# Patient Record
Sex: Female | Born: 1980 | Race: Black or African American | Hispanic: No | Marital: Married | Smoking: Never smoker
Health system: Southern US, Community
[De-identification: ages and names within clinical notes are randomized; demographics above are authoritative.]

## PROBLEM LIST (undated history)

## (undated) HISTORY — PX: TUBAL LIGATION: SHX77

## (undated) HISTORY — PX: ANKLE SURGERY: SHX546

---

## 2022-02-26 ENCOUNTER — Emergency Department (HOSPITAL_BASED_OUTPATIENT_CLINIC_OR_DEPARTMENT_OTHER): Payer: BC Managed Care – PPO

## 2022-02-26 ENCOUNTER — Emergency Department (HOSPITAL_BASED_OUTPATIENT_CLINIC_OR_DEPARTMENT_OTHER)
Admission: EM | Admit: 2022-02-26 | Discharge: 2022-02-26 | Disposition: A | Discharge status: Home / Self Care | Payer: BC Managed Care – PPO | Attending: Emergency Medicine | Admitting: Emergency Medicine

## 2022-02-26 ENCOUNTER — Encounter (HOSPITAL_BASED_OUTPATIENT_CLINIC_OR_DEPARTMENT_OTHER): Payer: Self-pay | Admitting: *Deleted

## 2022-02-26 DIAGNOSIS — S6991XA Unspecified injury of right wrist, hand and finger(s), initial encounter: Secondary | ICD-10-CM | POA: Diagnosis present

## 2022-02-26 DIAGNOSIS — S6391XA Sprain of unspecified part of right wrist and hand, initial encounter: Secondary | ICD-10-CM | POA: Diagnosis not present

## 2022-02-26 DIAGNOSIS — M79641 Pain in right hand: Secondary | ICD-10-CM

## 2022-02-26 NOTE — ED Notes (Signed)
Ice pack provided, encouraged elevation ?
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
## 2022-02-26 NOTE — ED Notes (Signed)
Splint to rt hand applied by NT per ED MD orders, CMS remained WNL. Pt states hand feels much better post splint application. AVS reviewed with client, discussed pain control with elevation, ice and ibuprofen all per ED PA recommendations. Also provided name of MD to follow up with. Copy of AVS given to pt. Opportunity for questions provided prior to DC to home with significant other.  ?

## 2022-02-26 NOTE — Discharge Instructions (Signed)
Motrin and Tylenol as needed as directed for pain.  Apply ice and elevate for 20 minutes at a time.  Follow-up with orthopedics if pain persists, referral given, call to schedule appointment. ?

## 2022-04-26 ENCOUNTER — Emergency Department (HOSPITAL_BASED_OUTPATIENT_CLINIC_OR_DEPARTMENT_OTHER): Payer: BC Managed Care – PPO

## 2022-04-26 ENCOUNTER — Emergency Department (HOSPITAL_BASED_OUTPATIENT_CLINIC_OR_DEPARTMENT_OTHER)
Admission: EM | Admit: 2022-04-26 | Discharge: 2022-04-26 | Disposition: A | Discharge status: Home / Self Care | Payer: BC Managed Care – PPO | Attending: Emergency Medicine | Admitting: Emergency Medicine

## 2022-04-26 ENCOUNTER — Encounter (HOSPITAL_BASED_OUTPATIENT_CLINIC_OR_DEPARTMENT_OTHER): Payer: Self-pay | Admitting: Emergency Medicine

## 2022-04-26 ENCOUNTER — Other Ambulatory Visit: Payer: Self-pay

## 2022-04-26 DIAGNOSIS — X58XXXA Exposure to other specified factors, initial encounter: Secondary | ICD-10-CM | POA: Diagnosis not present

## 2022-04-26 DIAGNOSIS — S39011A Strain of muscle, fascia and tendon of abdomen, initial encounter: Secondary | ICD-10-CM | POA: Diagnosis not present

## 2022-04-26 DIAGNOSIS — Y9389 Activity, other specified: Secondary | ICD-10-CM | POA: Insufficient documentation

## 2022-04-26 DIAGNOSIS — R109 Unspecified abdominal pain: Secondary | ICD-10-CM

## 2022-04-26 DIAGNOSIS — S3991XA Unspecified injury of abdomen, initial encounter: Secondary | ICD-10-CM | POA: Diagnosis present

## 2022-04-26 DIAGNOSIS — T148XXA Other injury of unspecified body region, initial encounter: Secondary | ICD-10-CM

## 2022-04-26 LAB — CBC WITH DIFFERENTIAL/PLATELET
Abs Immature Granulocytes: 0.01 10*3/uL (ref 0.00–0.07)
Basophils Absolute: 0 10*3/uL (ref 0.0–0.1)
Basophils Relative: 1 %
Eosinophils Absolute: 0.1 10*3/uL (ref 0.0–0.5)
Eosinophils Relative: 1 %
HCT: 32.6 % — ABNORMAL LOW (ref 36.0–46.0)
Hemoglobin: 10.4 g/dL — ABNORMAL LOW (ref 12.0–15.0)
Immature Granulocytes: 0 %
Lymphocytes Relative: 34 %
Lymphs Abs: 1.4 10*3/uL (ref 0.7–4.0)
MCH: 24.9 pg — ABNORMAL LOW (ref 26.0–34.0)
MCHC: 31.9 g/dL (ref 30.0–36.0)
MCV: 78 fL — ABNORMAL LOW (ref 80.0–100.0)
Monocytes Absolute: 0.5 10*3/uL (ref 0.1–1.0)
Monocytes Relative: 12 %
Neutro Abs: 2.2 10*3/uL (ref 1.7–7.7)
Neutrophils Relative %: 52 %
Platelets: 309 10*3/uL (ref 150–400)
RBC: 4.18 MIL/uL (ref 3.87–5.11)
RDW: 15.2 % (ref 11.5–15.5)
WBC: 4.2 10*3/uL (ref 4.0–10.5)
nRBC: 0 % (ref 0.0–0.2)

## 2022-04-26 LAB — URINALYSIS, ROUTINE W REFLEX MICROSCOPIC
Bilirubin Urine: NEGATIVE
Glucose, UA: NEGATIVE mg/dL
Ketones, ur: NEGATIVE mg/dL
Leukocytes,Ua: NEGATIVE
Nitrite: NEGATIVE
Protein, ur: NEGATIVE mg/dL
Specific Gravity, Urine: 1.025 (ref 1.005–1.030)
pH: 7 (ref 5.0–8.0)

## 2022-04-26 LAB — COMPREHENSIVE METABOLIC PANEL
ALT: 35 U/L (ref 0–44)
AST: 33 U/L (ref 15–41)
Albumin: 3.5 g/dL (ref 3.5–5.0)
Alkaline Phosphatase: 42 U/L (ref 38–126)
Anion gap: 7 (ref 5–15)
BUN: 12 mg/dL (ref 6–20)
CO2: 24 mmol/L (ref 22–32)
Calcium: 8.6 mg/dL — ABNORMAL LOW (ref 8.9–10.3)
Chloride: 106 mmol/L (ref 98–111)
Creatinine, Ser: 0.74 mg/dL (ref 0.44–1.00)
GFR, Estimated: >60 mL/min (ref >60)
Glucose, Bld: 95 mg/dL (ref 70–99)
Potassium: 3.6 mmol/L (ref 3.5–5.1)
Sodium: 137 mmol/L (ref 135–145)
Total Bilirubin: 0.3 mg/dL (ref 0.3–1.2)
Total Protein: 8.1 g/dL (ref 6.5–8.1)

## 2022-04-26 LAB — LIPASE, BLOOD: Lipase: 35 U/L (ref 11–51)

## 2022-04-26 LAB — URINALYSIS, MICROSCOPIC (REFLEX)

## 2022-04-26 LAB — PREGNANCY, URINE: Preg Test, Ur: NEGATIVE

## 2022-04-26 MED ORDER — METHOCARBAMOL 500 MG PO TABS: 500.0000 mg | ORAL_TABLET | Freq: Two times a day (BID) | ORAL | 0 refills | Status: DC

## 2022-04-26 MED ORDER — IBUPROFEN 800 MG PO TABS
800.0000 mg | ORAL_TABLET | Freq: Once | ORAL | Status: AC
  Administered 2022-04-26: 800 mg via ORAL
  Filled 2022-04-26: qty 1

## 2022-04-26 NOTE — ED Notes (Signed)
Patient to CT via wheelchair.

## 2022-04-26 NOTE — ED Triage Notes (Signed)
Pt c/o LUQ abdominal pain x 1 week. Last BM today. Describes pain as intermittent stabbing feeling. Denies urinary symptoms, n/v/d, fevers, vaginal discharge.

## 2022-04-26 NOTE — Discharge Instructions (Signed)
Please use Tylenol or ibuprofen for pain.  You may use 600 mg ibuprofen every 6 hours or 1000 mg of Tylenol every 6 hours.  You may choose to alternate between the 2.  This would be most effective.  Not to exceed 4 g of Tylenol within 24 hours.  Not to exceed 3200 mg ibuprofen 24 hours.  You can use the muscle relaxant I am prescribing up to twice daily in addition to the above.  So that may make you slightly drowsy, I would wait an hour after taking it for the first time before piloting a motor vehicle or operating heavy machinery to make sure that you do not feel too drowsy.  It is a nonnarcotic medications as long as you do not feel too drowsy or able to perform these activities.  If your symptoms persist despite treatment as above I recommend following up with your PCP and/or orthopedic doctor.

## 2022-04-26 NOTE — ED Notes (Signed)
Rx x 1 given  Written and verbal inst to pt  Verbalized an understanding  To home with son

## 2022-10-14 ENCOUNTER — Other Ambulatory Visit: Payer: Self-pay

## 2022-10-14 ENCOUNTER — Encounter (HOSPITAL_BASED_OUTPATIENT_CLINIC_OR_DEPARTMENT_OTHER): Payer: Self-pay | Admitting: Emergency Medicine

## 2022-10-14 ENCOUNTER — Emergency Department (HOSPITAL_BASED_OUTPATIENT_CLINIC_OR_DEPARTMENT_OTHER)
Admission: EM | Admit: 2022-10-14 | Discharge: 2022-10-15 | Disposition: A | Discharge status: Home / Self Care | Payer: BC Managed Care – PPO | Attending: Emergency Medicine | Admitting: Emergency Medicine

## 2022-10-14 DIAGNOSIS — R1012 Left upper quadrant pain: Secondary | ICD-10-CM | POA: Diagnosis present

## 2022-10-14 DIAGNOSIS — K5901 Slow transit constipation: Secondary | ICD-10-CM | POA: Insufficient documentation

## 2022-10-14 LAB — CBC WITH DIFFERENTIAL/PLATELET
Abs Immature Granulocytes: 0.01 10*3/uL (ref 0.00–0.07)
Basophils Absolute: 0 10*3/uL (ref 0.0–0.1)
Basophils Relative: 1 %
Eosinophils Absolute: 0.1 10*3/uL (ref 0.0–0.5)
Eosinophils Relative: 3 %
HCT: 33.9 % — ABNORMAL LOW (ref 36.0–46.0)
Hemoglobin: 10.8 g/dL — ABNORMAL LOW (ref 12.0–15.0)
Immature Granulocytes: 0 %
Lymphocytes Relative: 50 %
Lymphs Abs: 2.1 10*3/uL (ref 0.7–4.0)
MCH: 24.3 pg — ABNORMAL LOW (ref 26.0–34.0)
MCHC: 31.9 g/dL (ref 30.0–36.0)
MCV: 76.2 fL — ABNORMAL LOW (ref 80.0–100.0)
Monocytes Absolute: 0.5 10*3/uL (ref 0.1–1.0)
Monocytes Relative: 11 %
Neutro Abs: 1.5 10*3/uL — ABNORMAL LOW (ref 1.7–7.7)
Neutrophils Relative %: 35 %
Platelets: 401 10*3/uL — ABNORMAL HIGH (ref 150–400)
RBC: 4.45 MIL/uL (ref 3.87–5.11)
RDW: 14.6 % (ref 11.5–15.5)
WBC: 4.2 10*3/uL (ref 4.0–10.5)
nRBC: 0 % (ref 0.0–0.2)

## 2022-10-14 LAB — URINALYSIS, ROUTINE W REFLEX MICROSCOPIC
Bilirubin Urine: NEGATIVE
Glucose, UA: NEGATIVE mg/dL
Ketones, ur: NEGATIVE mg/dL
Leukocytes,Ua: NEGATIVE
Nitrite: NEGATIVE
Protein, ur: NEGATIVE mg/dL
Specific Gravity, Urine: 1.015 (ref 1.005–1.030)
pH: 7.5 (ref 5.0–8.0)

## 2022-10-14 LAB — URINALYSIS, MICROSCOPIC (REFLEX)

## 2022-10-14 LAB — PREGNANCY, URINE: Preg Test, Ur: NEGATIVE

## 2022-10-14 MED ORDER — FENTANYL CITRATE PF 50 MCG/ML IJ SOSY
50.0000 ug | PREFILLED_SYRINGE | Freq: Once | INTRAMUSCULAR | Status: AC
  Administered 2022-10-15: 50 ug via INTRAVENOUS
  Filled 2022-10-14: qty 1

## 2022-10-14 MED ORDER — ONDANSETRON HCL 4 MG/2ML IJ SOLN
4.0000 mg | Freq: Once | INTRAMUSCULAR | Status: AC
  Administered 2022-10-15: 4 mg via INTRAVENOUS
  Filled 2022-10-14: qty 2

## 2022-10-14 NOTE — ED Provider Notes (Signed)
   [facility] Provider Note: Lowella Dell, MD, FACEP  CSN: [id number] MRN: [id number] ARRIVAL: 10/14/22 at 2308 ROOM: [facility]10/[facility]10   CHIEF COMPLAINT  Abdominal Pain   HISTORY OF PRESENT ILLNESS  10/14/22 11:27 PM Brenda Preston is a 41 y.o. female who was treated for pyelonephritis with 10 days of Macrobid.  Her symptoms were left upper quadrant pain, urinary urgency, urinary frequency and burning with urination.  Despite taking the entire course of Macrobid her symptoms have persisted and in fact have worsened somewhat, particularly of the left upper quadrant pain.  She describes the pain as aching and sharp.  It is worse with movement or palpation.  She is having some low back pain with this as well as nausea but no vomiting.  She has had chills but is not aware of having fever.   No past medical history on file.  Past Surgical History:  Procedure Laterality Date   ANKLE SURGERY Right    TUBAL LIGATION Bilateral     No family history on file.  Social History   Tobacco Use   Smoking status: Never   Smokeless tobacco: Never  Substance Use Topics   Alcohol use: Yes    Comment: 3+ liquor drinks per day   Drug use: Never    Prior to Admission medications   Medication Sig Start Date End Date Taking? Authorizing Provider  methocarbamol (ROBAXIN) 500 MG tablet Take 1 tablet (500 mg total) by mouth 2 (two) times daily. 04/26/22   Prosperi, Christian H, PA-C    Allergies Hydrocodone   REVIEW OF SYSTEMS  Negative except as noted here or in the History of Present Illness.   PHYSICAL EXAMINATION  Initial Vital Signs Blood pressure (!) 162/102, pulse 76, temperature 98.2 F (36.8 C), temperature source Oral, resp. rate 20, height 5\' 8"  (1.727 m), weight 95.3 kg, SpO2 99 %.  Examination General: Well-developed, well-nourished female in no acute distress; appearance consistent with age of record HENT: normocephalic; atraumatic Eyes: Normal appearance Neck:  supple Heart: regular rate and rhythm Lungs: clear to auscultation bilaterally Abdomen: soft; nondistended; left upper quadrant tenderness; suprapubic tenderness; bowel sounds present GU: No CVA tenderness Extremities: No deformity; full range of motion Neurologic: Awake, alert and oriented; motor function intact in all extremities and symmetric; no facial droop Skin: Warm and dry Psychiatric: Normal mood and affect   RESULTS  Summary of this visit's results, reviewed and interpreted by myself:   EKG Interpretation  Date/Time:    Ventricular Rate:    PR Interval:    QRS Duration:   QT Interval:    QTC Calculation:   R Axis:     Text Interpretation:         Laboratory Studies: No results found for this or any previous visit (from the past 24 hour(s)). Imaging Studies: No results found.  ED COURSE and MDM  Nursing notes, initial and subsequent vitals signs, including pulse oximetry, reviewed and interpreted by myself.  Vitals:   10/14/22 2325 10/14/22 2326  BP: (!) 162/102   Pulse: 76   Resp: 20   Temp: 98.2 F (36.8 C)   TempSrc: Oral   SpO2: 99%   Weight:  95.3 kg  Height:  5\' 8"  (1.727 m)   Medications - No data to display    PROCEDURES  Procedures   ED DIAGNOSES  No diagnosis found.

## 2022-10-14 NOTE — ED Triage Notes (Signed)
Pt states she was seen and tx for pyelonephritis ~ 10 days ago. She finished the abx (Macrobid) and continues having L flank and LLQ pain. She states "there is something in my pee", and c/o feeling generally unwell.

## 2022-10-15 ENCOUNTER — Emergency Department (HOSPITAL_BASED_OUTPATIENT_CLINIC_OR_DEPARTMENT_OTHER): Payer: BC Managed Care – PPO

## 2022-10-15 ENCOUNTER — Encounter (HOSPITAL_BASED_OUTPATIENT_CLINIC_OR_DEPARTMENT_OTHER): Payer: Self-pay

## 2022-10-15 LAB — COMPREHENSIVE METABOLIC PANEL
ALT: 42 U/L (ref 0–44)
AST: 34 U/L (ref 15–41)
Albumin: 4.2 g/dL (ref 3.5–5.0)
Alkaline Phosphatase: 50 U/L (ref 38–126)
Anion gap: 9 (ref 5–15)
BUN: 15 mg/dL (ref 6–20)
CO2: 25 mmol/L (ref 22–32)
Calcium: 9 mg/dL (ref 8.9–10.3)
Chloride: 104 mmol/L (ref 98–111)
Creatinine, Ser: 1.08 mg/dL — ABNORMAL HIGH (ref 0.44–1.00)
GFR, Estimated: >60 mL/min (ref >60)
Glucose, Bld: 94 mg/dL (ref 70–99)
Potassium: 3.6 mmol/L (ref 3.5–5.1)
Sodium: 138 mmol/L (ref 135–145)
Total Bilirubin: 0.3 mg/dL (ref 0.3–1.2)
Total Protein: 9.2 g/dL — ABNORMAL HIGH (ref 6.5–8.1)

## 2022-10-15 LAB — LIPASE, BLOOD: Lipase: 53 U/L — ABNORMAL HIGH (ref 11–51)

## 2022-10-15 MED ORDER — IOHEXOL 300 MG/ML  SOLN
100.0000 mL | Freq: Once | INTRAMUSCULAR | Status: AC | PRN
  Administered 2022-10-15: 100 mL via INTRAVENOUS

## 2022-10-15 MED ORDER — MAGNESIUM CITRATE PO SOLN: 1.0000 | Freq: Once | ORAL | Status: DC

## 2022-10-15 NOTE — ED Notes (Signed)
Patient will buy laxative over counter.

## 2022-10-16 LAB — URINE CULTURE: Culture: <10000 — AB

## 2022-11-29 ENCOUNTER — Emergency Department (HOSPITAL_BASED_OUTPATIENT_CLINIC_OR_DEPARTMENT_OTHER)
Admission: EM | Admit: 2022-11-29 | Discharge: 2022-11-29 | Disposition: A | Discharge status: Home / Self Care | Payer: 59 | Attending: Emergency Medicine | Admitting: Emergency Medicine

## 2022-11-29 ENCOUNTER — Other Ambulatory Visit: Payer: Self-pay

## 2022-11-29 ENCOUNTER — Encounter (HOSPITAL_BASED_OUTPATIENT_CLINIC_OR_DEPARTMENT_OTHER): Payer: Self-pay | Admitting: Emergency Medicine

## 2022-11-29 ENCOUNTER — Emergency Department (HOSPITAL_BASED_OUTPATIENT_CLINIC_OR_DEPARTMENT_OTHER): Payer: 59

## 2022-11-29 DIAGNOSIS — M5416 Radiculopathy, lumbar region: Secondary | ICD-10-CM | POA: Insufficient documentation

## 2022-11-29 DIAGNOSIS — M549 Dorsalgia, unspecified: Secondary | ICD-10-CM | POA: Diagnosis present

## 2022-11-29 MED ORDER — DICLOFENAC SODIUM 50 MG PO TBEC: 50.0000 mg | DELAYED_RELEASE_TABLET | Freq: Two times a day (BID) | ORAL | 0 refills | Status: AC

## 2022-11-29 MED ORDER — METHOCARBAMOL 500 MG PO TABS: 1000.0000 mg | ORAL_TABLET | Freq: Three times a day (TID) | ORAL | 0 refills | Status: DC | PRN

## 2022-11-29 MED ORDER — FAMOTIDINE 20 MG PO TABS: 20.0000 mg | ORAL_TABLET | Freq: Two times a day (BID) | ORAL | 0 refills | Status: AC

## 2022-11-29 MED ORDER — OXYCODONE-ACETAMINOPHEN 5-325 MG PO TABS
1.0000 | ORAL_TABLET | Freq: Once | ORAL | Status: AC
  Administered 2022-11-29: 1 via ORAL
  Filled 2022-11-29: qty 1

## 2022-11-29 MED ORDER — METHYLPREDNISOLONE 4 MG PO TBPK: ORAL_TABLET | ORAL | 0 refills | Status: AC

## 2022-11-29 NOTE — ED Triage Notes (Signed)
Left side back pain X 2 days states runs down leg and "feels  funny" denies injury.

## 2022-11-29 NOTE — ED Provider Notes (Signed)
[place] [facility] Provider Note   CSN: [id number] Arrival date & time: 11/29/22  2751     History  Chief Complaint  Patient presents with   Back Pain    Brenda Preston is a 42 y.o. female.  The history is provided by the patient.  Back Pain Brenda Preston is a 42 y.o. female who presents to the Emergency Department complaining of leg pain.  She presents to the emergency department complaining of pain to her left buttocks, hip and leg.  Pain started 2 days ago.  It is described as a shooting constant pain.  It does not change with movement.  She did take Motrin and tizanidine prior to ED arrival with very mild improvement in her symptoms.  She states it is difficult to find a comfortable position.  No reported injuries but she did recently start a job at [employer] 2 weeks ago.  No back pain, abdominal pain, fevers, nausea, vomiting, dysuria, incontinence.  No known medical problems.  No routine medications.  She does have a remote history of sciatica years ago but has not had an issue recently.  She does not have numbness or weakness in that leg but she feels like it is hard to move at times and is not sure if this is secondary to pain or strength issues.   No tobacco, occasional alcohol, no street drugs.   No hx/o blood stream infection.  No recent surgery.      Home Medications Prior to Admission medications   Medication Sig Start Date End Date Taking? Authorizing Provider  diclofenac (VOLTAREN) 50 MG EC tablet Take 1 tablet (50 mg total) by mouth 2 (two) times daily. 11/29/22  Yes Quintella Reichert, MD  famotidine (PEPCID) 20 MG tablet Take 1 tablet (20 mg total) by mouth 2 (two) times daily. 11/29/22  Yes Quintella Reichert, MD  methocarbamol (ROBAXIN) 500 MG tablet Take 2 tablets (1,000 mg total) by mouth every 8 (eight) hours as needed for muscle spasms. 11/29/22  Yes Quintella Reichert, MD  methylPREDNISolone (MEDROL DOSEPAK) 4 MG TBPK tablet Take  according to label instructions 11/29/22  Yes Quintella Reichert, MD      Allergies    Hydrocodone    Review of Systems   Review of Systems  Musculoskeletal:  Positive for back pain.  All other systems reviewed and are negative.   Physical Exam Updated Vital Signs BP 118/81 (BP Location: Right Arm)   Pulse 93   Temp 99.2 F (37.3 C) (Oral)   Resp 16   Ht 5\' 7"  (1.702 m)   Wt 95.3 kg   LMP 11/05/2022 (Approximate)   SpO2 99%   BMI 32.89 kg/m  Physical Exam Vitals and nursing note reviewed.  Constitutional:      Appearance: She is well-developed.  HENT:     Head: Normocephalic and atraumatic.  Cardiovascular:     Rate and Rhythm: Normal rate and regular rhythm.  Pulmonary:     Effort: Pulmonary effort is normal. No respiratory distress.  Abdominal:     Palpations: Abdomen is soft.     Tenderness: There is no abdominal tenderness. There is no guarding or rebound.  Musculoskeletal:     Comments: 2+ DP pulses bilaterally.  There is no midline thoracic or lumbar tenderness to palpation.  There is mild tenderness palpation over the left lateral hip.  She is able to fully range the hip and knee.  Skin:    General: Skin is warm  and dry.  Neurological:     Mental Status: She is alert and oriented to person, place, and time.     Comments: 5 out of 5 strength in bilateral lower extremities with sensation to light touch intact in bilateral lower extremities.  2+ patellar reflexes bilaterally.  No ankle clonus.  Antalgic gait.  Psychiatric:        Behavior: Behavior normal.     ED Results / Procedures / Treatments   Labs (all labs ordered are listed, but only abnormal results are displayed) Labs Reviewed - No data to display  EKG None  Radiology DG Hip Unilat W or Wo Pelvis 2-3 Views Left  Result Date: 11/29/2022 CLINICAL DATA:  42 year old female with left side back pain radiating to the left hip and leg for 2 days with no known injury. EXAM: DG HIP (WITH OR WITHOUT  PELVIS) 2-3V LEFT COMPARISON:  CT Abdomen and Pelvis 10/15/2022. FINDINGS: Weightbearing views. Bone mineralization is within normal limits. Femoral heads are normally located. Hip joint spaces appear stable and symmetric. Pelvis appears intact. SI joints are symmetric. Negative lower abdominal and pelvic visceral contours. Incidental pelvic phleboliths. Grossly intact proximal right femur. Proximal left femur appears intact and normal. Small suspected external metallic object projects near the pubic symphysis. IMPRESSION: Negative. Electronically Signed   By: Genevie Ann M.D.   On: 11/29/2022 06:27    Procedures Procedures    Medications Ordered in ED Medications  oxyCODONE-acetaminophen (PERCOCET/ROXICET) 5-325 MG per tablet 1 tablet (1 tablet Oral Given 11/29/22 0601)    ED Course/ Medical Decision Making/ A&P                             Medical Decision Making Amount and/or Complexity of Data Reviewed Radiology: ordered.  Risk Prescription drug management.   Patient with 2 days of pain to the left leg and buttocks that radiates to her foot.  She is neurovascularly intact on evaluation with antalgic gait.  No red flags based off of history and examination.  Patient is improved after pain medications in the emergency department.  Left hip x-ray without acute abnormality-images personally reviewed and interpreted, agree with radiologist interpretation.  Patient does have a radicular pattern of pain, no evidence of acute cord injury.  No evidence of acute infectious process.  Discussed with patient home care for maculopathy with close return precautions for progressive or concerning symptoms.  Discussed continuing acetaminophen at home, will prescribe Medrol Dosepak, diclofenac, methocarbamol.  Discussed stopping additional over-the-counter NSAIDs while taking these medications as well as risk of side effects from medications.        Final Clinical Impression(s) / ED Diagnoses Final  diagnoses:  Lumbar radiculopathy    Rx / DC Orders ED Discharge Orders          Ordered    methocarbamol (ROBAXIN) 500 MG tablet  Every 8 hours PRN        11/29/22 0645    diclofenac (VOLTAREN) 50 MG EC tablet  2 times daily        11/29/22 0645    methylPREDNISolone (MEDROL DOSEPAK) 4 MG TBPK tablet        11/29/22 0645    famotidine (PEPCID) 20 MG tablet  2 times daily        11/29/22 0645              Quintella Reichert, MD 11/29/22 [phone]

## 2022-11-29 NOTE — ED Notes (Signed)
Patient transported to X-ray 
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
## 2023-07-22 IMAGING — CT CT RENAL STONE PROTOCOL
2 of 4 series · 16 of 46 positions shown, 18 images · non-contrast
Comparison: None Available.

CLINICAL DATA: Left-sided flank pain for 1 week, initial encounter



[Series 2: axial st · axial · 0.98mm/px · z∈[+419,+874]mm · 13 of 101 slices shown, 15 images]
[im 5/101  soft-tissue]
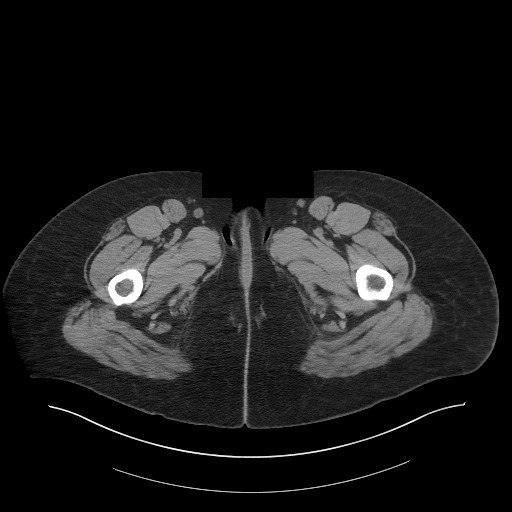
[im 5/101  bone]
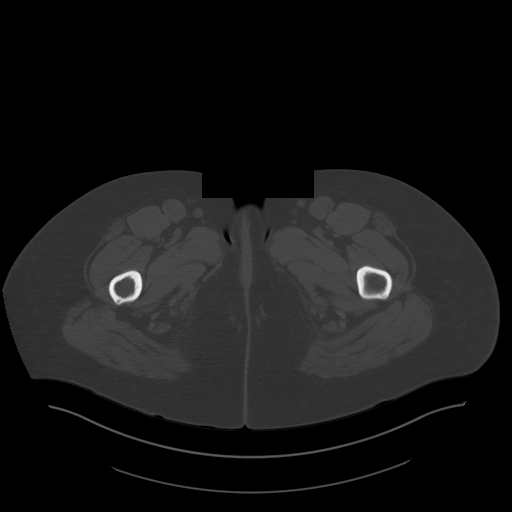
[im 13/101  soft-tissue]
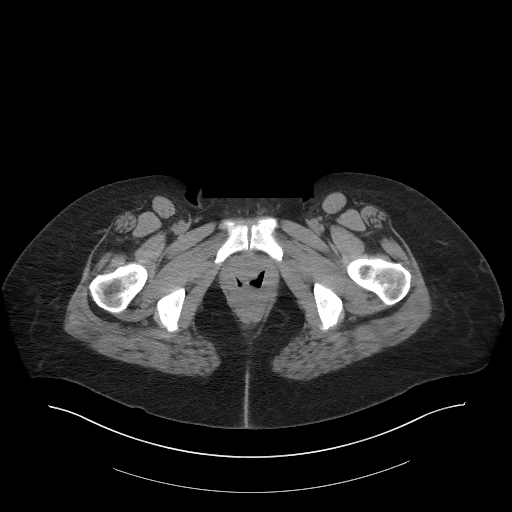
[im 21/101  soft-tissue]
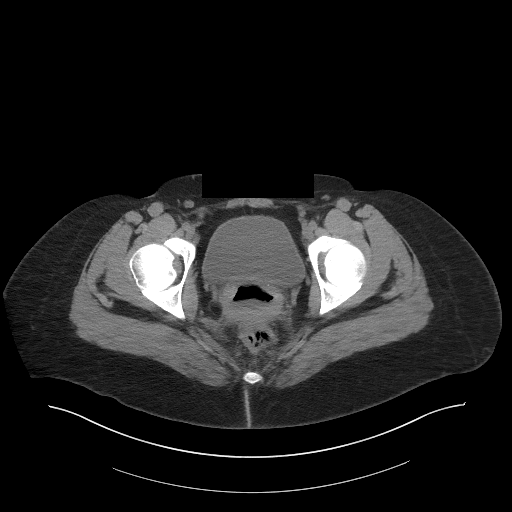
[im 30/101  soft-tissue]
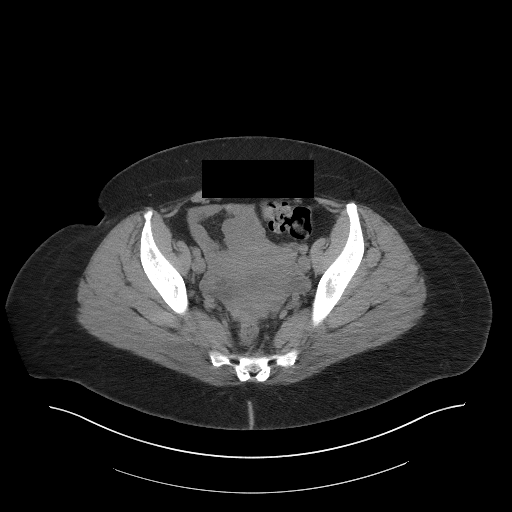
[im 34/101  soft-tissue]
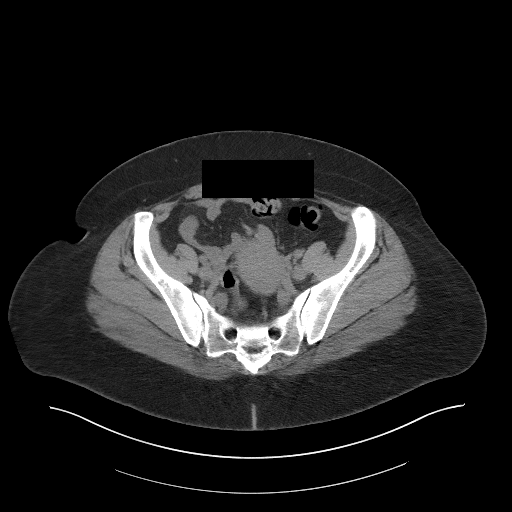
[im 42/101  soft-tissue]
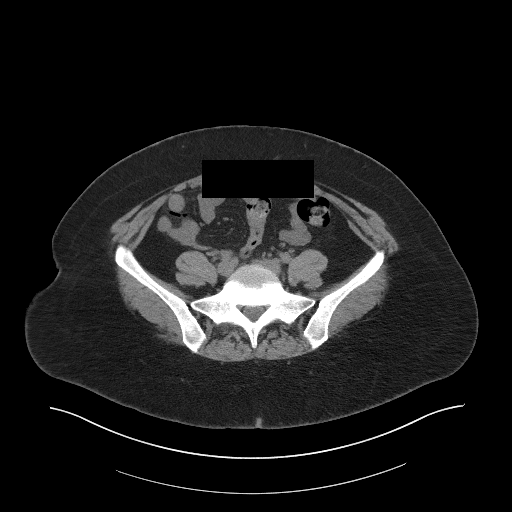
[im 51/101  soft-tissue]
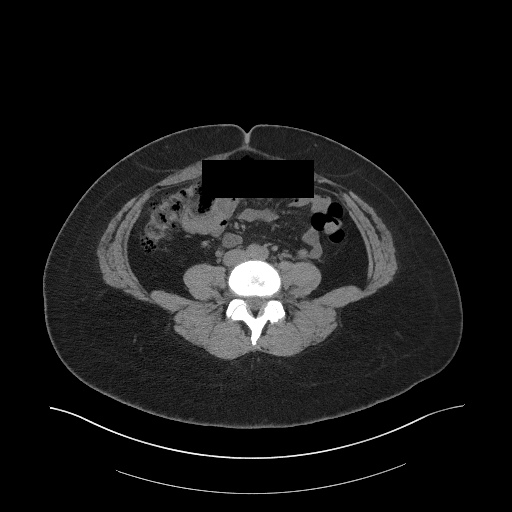
[im 59/101  soft-tissue]
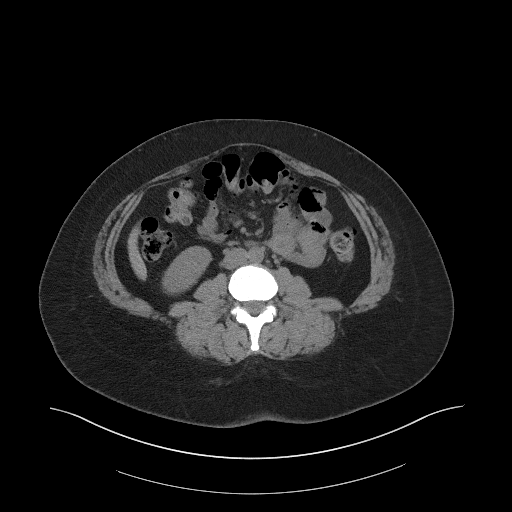
[im 67/101  soft-tissue]
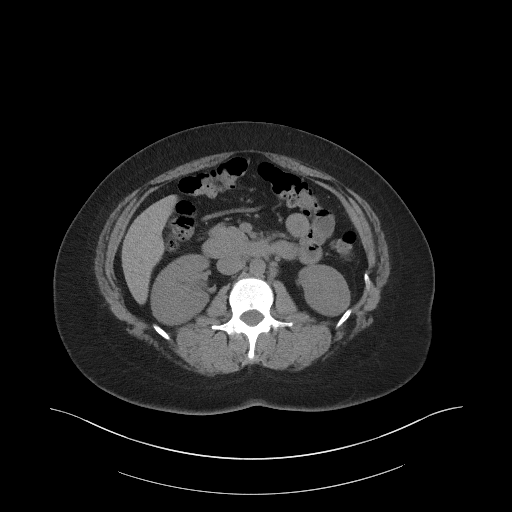
[im 67/101  bone]
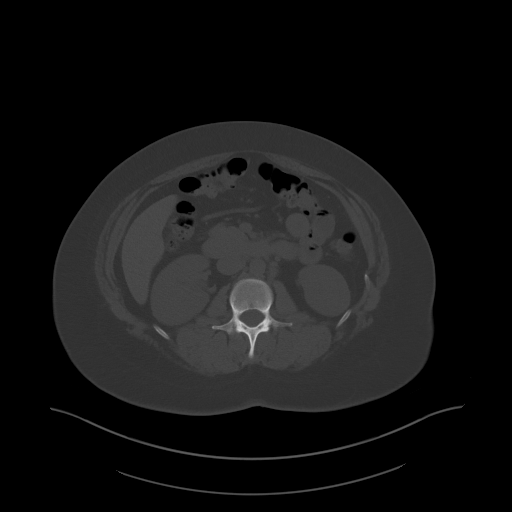
[im 71/101  soft-tissue]
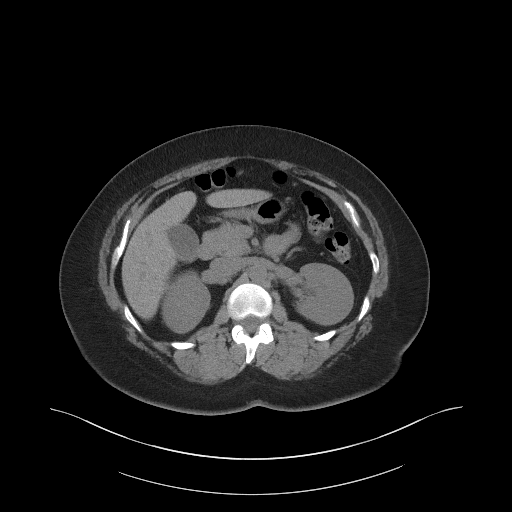
[im 80/101  soft-tissue]
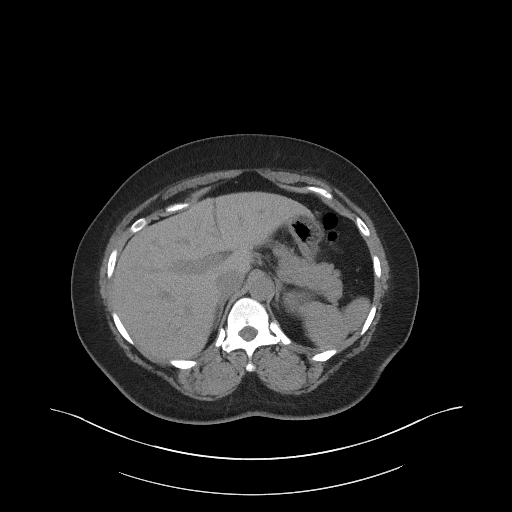
[im 88/101  soft-tissue]
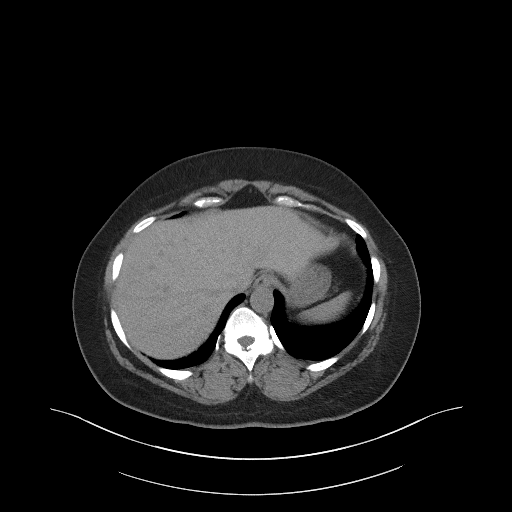
[im 96/101  soft-tissue]
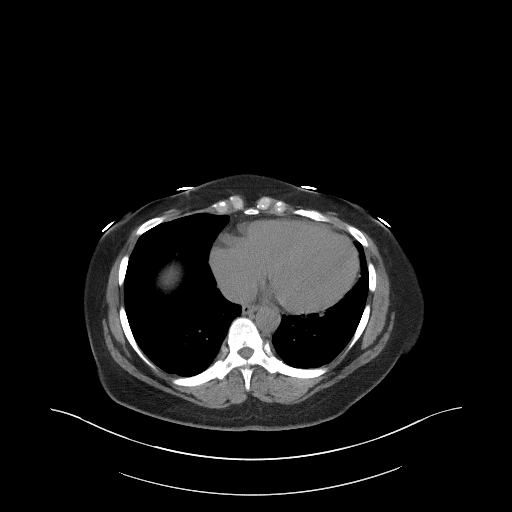

[Series 5: coronal st · coronal · 0.83mm/px · 3 of 101 slices shown]
[im 34/101  soft-tissue]
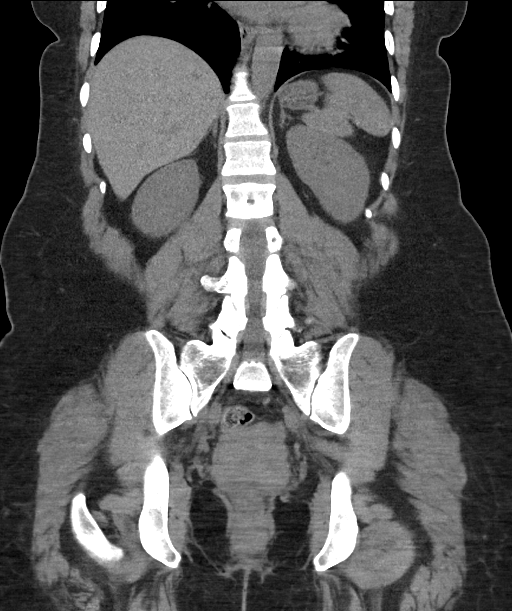
[im 45/101  soft-tissue]
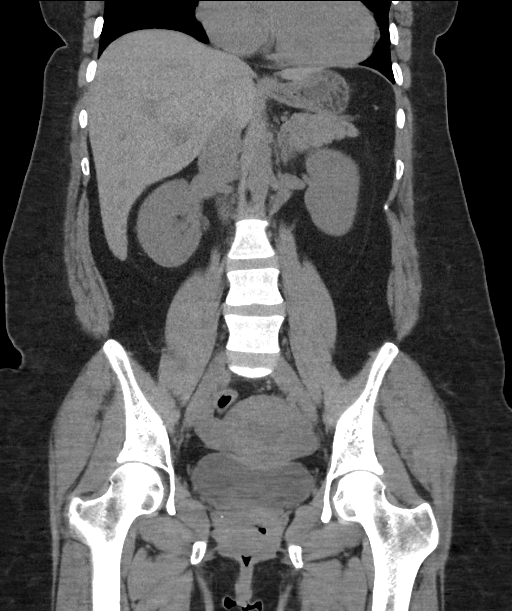
[im 56/101  soft-tissue]
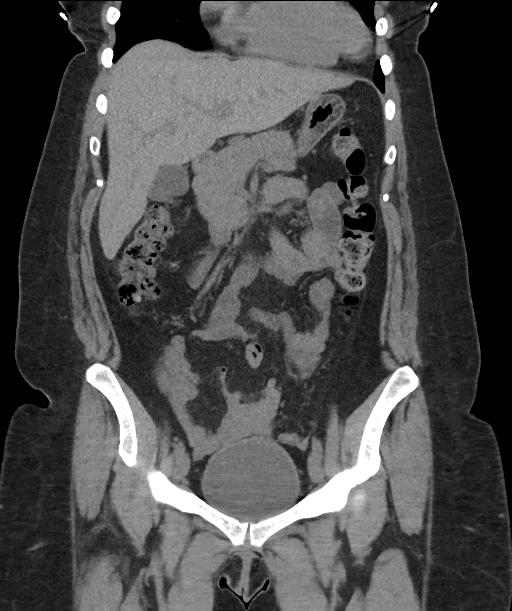

[16 of 46 positions shown; findings below may reference images not displayed]

FINDINGS: Lower chest: No acute abnormality.

Hepatobiliary: No focal liver abnormality is seen. No gallstones,
gallbladder wall thickening, or biliary dilatation.

Pancreas: Unremarkable. No pancreatic ductal dilatation or
surrounding inflammatory changes.

Spleen: Normal in size without focal abnormality.

Adrenals/Urinary Tract: Adrenal glands are within normal limits.
Kidneys show no renal calculi or obstructive changes. Ureters are
within normal limits. The bladder is partially distended.

Stomach/Bowel: The appendix is within normal limits. No obstructive
or inflammatory changes of colon are seen. Small bowel and stomach
are within normal limits.

Vascular/Lymphatic: No significant vascular findings are present. No
enlarged abdominal or pelvic lymph nodes.

Reproductive: Uterus and bilateral adnexa are unremarkable.

Other: No abdominal wall hernia or abnormality. No abdominopelvic
ascites.

Musculoskeletal: No acute or significant osseous findings.
IMPRESSION: No acute abnormality noted.

## 2024-10-10 ENCOUNTER — Emergency Department (HOSPITAL_BASED_OUTPATIENT_CLINIC_OR_DEPARTMENT_OTHER)

## 2024-10-10 ENCOUNTER — Encounter (HOSPITAL_BASED_OUTPATIENT_CLINIC_OR_DEPARTMENT_OTHER): Payer: Self-pay | Admitting: Urology

## 2024-10-10 ENCOUNTER — Emergency Department (HOSPITAL_BASED_OUTPATIENT_CLINIC_OR_DEPARTMENT_OTHER)
Admission: EM | Admit: 2024-10-10 | Discharge: 2024-10-10 | Disposition: A | Discharge status: Home / Self Care | Attending: Emergency Medicine | Admitting: Emergency Medicine

## 2024-10-10 ENCOUNTER — Other Ambulatory Visit: Payer: Self-pay

## 2024-10-10 DIAGNOSIS — R10A2 Flank pain, left side: Secondary | ICD-10-CM

## 2024-10-10 DIAGNOSIS — K5900 Constipation, unspecified: Secondary | ICD-10-CM | POA: Diagnosis not present

## 2024-10-10 DIAGNOSIS — M546 Pain in thoracic spine: Secondary | ICD-10-CM | POA: Diagnosis not present

## 2024-10-10 LAB — BASIC METABOLIC PANEL WITH GFR
Anion gap: 10 (ref 5–15)
BUN: 12 mg/dL (ref 6–20)
CO2: 26 mmol/L (ref 22–32)
Calcium: 9.1 mg/dL (ref 8.9–10.3)
Chloride: 102 mmol/L (ref 98–111)
Creatinine, Ser: 0.87 mg/dL (ref 0.44–1.00)
GFR, Estimated: >60 mL/min (ref >60)
Glucose, Bld: 111 mg/dL — ABNORMAL HIGH (ref 70–99)
Potassium: 4 mmol/L (ref 3.5–5.1)
Sodium: 137 mmol/L (ref 135–145)

## 2024-10-10 LAB — URINALYSIS, ROUTINE W REFLEX MICROSCOPIC
Bilirubin Urine: NEGATIVE
Glucose, UA: NEGATIVE mg/dL
Hgb urine dipstick: NEGATIVE
Ketones, ur: NEGATIVE mg/dL
Leukocytes,Ua: NEGATIVE
Nitrite: NEGATIVE
Protein, ur: NEGATIVE mg/dL
Specific Gravity, Urine: 1.015 (ref 1.005–1.030)
pH: 8.5 — ABNORMAL HIGH (ref 5.0–8.0)

## 2024-10-10 LAB — CBC
HCT: 33.3 % — ABNORMAL LOW (ref 36.0–46.0)
Hemoglobin: 10.5 g/dL — ABNORMAL LOW (ref 12.0–15.0)
MCH: 24 pg — ABNORMAL LOW (ref 26.0–34.0)
MCHC: 31.5 g/dL (ref 30.0–36.0)
MCV: 76 fL — ABNORMAL LOW (ref 80.0–100.0)
Platelets: 281 K/uL (ref 150–400)
RBC: 4.38 MIL/uL (ref 3.87–5.11)
RDW: 17.8 % — ABNORMAL HIGH (ref 11.5–15.5)
WBC: 3.8 K/uL — ABNORMAL LOW (ref 4.0–10.5)
nRBC: 0 % (ref 0.0–0.2)

## 2024-10-10 LAB — PREGNANCY, URINE: Preg Test, Ur: NEGATIVE

## 2024-10-10 MED ORDER — METHOCARBAMOL 500 MG PO TABS: 500.0000 mg | ORAL_TABLET | Freq: Two times a day (BID) | ORAL | 0 refills | Status: AC | PRN

## 2024-10-10 NOTE — ED Triage Notes (Signed)
 Pt states left side kidney pain x 1 week  States associated nausea and fatigue  Denies any pain with urination

## 2024-10-10 NOTE — ED Provider Notes (Signed)
 [place] [facility] Provider Note   CSN: [id number] Arrival date & time: 10/10/24  1600     Patient presents with: Flank Pain   Brenda Preston is a 43 y.o. female.   Patient is here for evaluation of left-sided flank pain.  Patient has been having left-sided flank pain for the past week that worsened today.  She also reports fatigue since symptom onset as well as intermittent nausea without emesis.  She denies dysuria, hematuria, increased frequency, or foul-smelling urine.  She does report occasional dark-colored urine.  Patient works in [employer] where she is repeatedly lifting things so she initially thought the pain was musculoskeletal in nature.  She also has a history of sciatic nerve pain though this pain has slightly above where that normally is.  However when the pain got worse today he decided to come in for further evaluation.  She has had a kidney infection in the past though it has been many years ago so she cannot recall exactly how the pain had felt.  She has had UTIs in the past and states she typically has low mid abdominal pain with those.  She denies any abdominal pain at this time.  She also denies any recent fevers, V/D, shortness of breath, chest pain, or any recent illnesses.  She denies any change in medications.  Last bowel movement was this morning and it was normal without any blood in stool or melena.  She occasionally has constipation for which she takes over-the-counter medications.    The history is provided by the patient.  Flank Pain This is a new problem. The current episode started more than 2 days ago.       Prior to Admission medications   Medication Sig Start Date End Date Taking? Authorizing Provider  methocarbamol  (ROBAXIN ) 500 MG tablet Take 1 tablet (500 mg total) by mouth 2 (two) times daily as needed for muscle spasms. 10/10/24  Yes Rosina Almarie LABOR, PA-C  diclofenac  (VOLTAREN ) 50 MG EC  tablet Take 1 tablet (50 mg total) by mouth 2 (two) times daily. 11/29/22   Griselda Almarie, MD  famotidine  (PEPCID ) 20 MG tablet Take 1 tablet (20 mg total) by mouth 2 (two) times daily. 11/29/22   Griselda Almarie, MD  methylPREDNISolone  (MEDROL  DOSEPAK) 4 MG TBPK tablet Take according to label instructions 11/29/22   Griselda Almarie, MD    Allergies: Hydrocodone    Review of Systems  Genitourinary:  Positive for flank pain.    Updated Vital Signs BP (!) 148/94 (BP Location: Right Arm)   Pulse 83   Temp 98.1 F (36.7 C)   Resp 20   Ht 5' 7 (1.702 m)   Wt 95.3 kg   SpO2 96%   BMI 32.91 kg/m   Physical Exam Vitals and nursing note reviewed.  Constitutional:      General: She is not in acute distress.    Appearance: Normal appearance. She is not ill-appearing or toxic-appearing.  HENT:     Head: Normocephalic and atraumatic.     Nose: Nose normal.     Mouth/Throat:     Mouth: Mucous membranes are moist.  Eyes:     General: No scleral icterus.    Extraocular Movements: Extraocular movements intact.     Conjunctiva/sclera: Conjunctivae normal.  Cardiovascular:     Rate and Rhythm: Normal rate and regular rhythm.     Pulses: Normal pulses.     Heart sounds: Normal heart sounds.  Pulmonary:     Effort: Pulmonary effort is normal. No respiratory distress.     Breath sounds: Normal breath sounds. No stridor. No wheezing, rhonchi or rales.  Abdominal:     General: Abdomen is flat. Bowel sounds are normal. There is no distension.     Palpations: Abdomen is soft.     Tenderness: There is no abdominal tenderness. There is no right CVA tenderness, left CVA tenderness or guarding.  Musculoskeletal:        General: Tenderness (Tenderness with palpation of left posterior low thoracic region.) present. No swelling, deformity or signs of injury. Normal range of motion.  Skin:    General: Skin is warm and dry.     Capillary Refill: Capillary refill takes less than 2 seconds.      Coloration: Skin is not jaundiced or pale.  Neurological:     Mental Status: She is alert and oriented to person, place, and time.     (all labs ordered are listed, but only abnormal results are displayed) Labs Reviewed  URINALYSIS, ROUTINE W REFLEX MICROSCOPIC - Abnormal; Notable for the following components:      Result Value   pH 8.5 (*)    All other components within normal limits  BASIC METABOLIC PANEL WITH GFR - Abnormal; Notable for the following components:   Glucose, Bld 111 (*)    All other components within normal limits  CBC - Abnormal; Notable for the following components:   WBC 3.8 (*)    Hemoglobin 10.5 (*)    HCT 33.3 (*)    MCV 76.0 (*)    MCH 24.0 (*)    RDW 17.8 (*)    All other components within normal limits  PREGNANCY, URINE    EKG: None  Radiology: CT Renal Stone Study Result Date: 10/10/2024 CLINICAL DATA:  Left-sided flank pain for 1 week, nausea and fatigue EXAM: CT ABDOMEN AND PELVIS WITHOUT CONTRAST TECHNIQUE: Multidetector CT imaging of the abdomen and pelvis was performed following the standard protocol without IV contrast. RADIATION DOSE REDUCTION: This exam was performed according to the departmental dose-optimization program which includes automated exposure control, adjustment of the mA and/or kV according to patient size and/or use of iterative reconstruction technique. COMPARISON:  10/15/2022 FINDINGS: Lower chest: No acute pleural or parenchymal lung disease. Hepatobiliary: Unremarkable unenhanced appearance of the liver and gallbladder. Pancreas: Unremarkable unenhanced appearance. Spleen: Unremarkable unenhanced appearance. Adrenals/Urinary Tract: No urinary tract calculi or obstructive uropathy within either kidney. The adrenals and bladder are unremarkable. Stomach/Bowel: No bowel obstruction or ileus. Normal appendix right lower quadrant. No bowel wall thickening or inflammatory change. Vascular/Lymphatic: No significant vascular findings are  present. No enlarged abdominal or pelvic lymph nodes. Reproductive: Uterus and bilateral adnexa are unremarkable. Other: No free fluid or free intraperitoneal gas. No abdominal wall hernia. Musculoskeletal: No acute or destructive bony abnormalities. Reconstructed images demonstrate no additional findings. IMPRESSION: 1. Unremarkable unenhanced CT of the abdomen and pelvis. No urinary tract calculi or obstructive uropathy. Electronically Signed   By: Ozell Daring M.D.   On: 10/10/2024 20:06     Procedures   Medications Ordered in the ED - No data to display   Patient presents to the ED for concern of left-sided flank pain, this involves an extensive number of treatment options, and is a complaint that carries with it a high risk of complications and morbidity.  The differential diagnosis includes pyelonephritis, kidney stone, UTI, musculoskeletal***   Co morbidities that complicate the patient evaluation  none  Additional history obtained:  Additional history obtained from *** {Blank multiple:19196::EMS,Family,Nursing,Outside Medical Records,Past Admission}   External records from outside source obtained and reviewed including ***   Lab Tests:  I Ordered, and personally interpreted labs.  The pertinent results include:  ***   Imaging Studies ordered:  I ordered imaging studies including CT renal study I independently visualized and interpreted imaging which showed no urinary tract calculi or obstructive uropathy. I agree with the radiologist interpretation   Problem List / ED Course:  Left flank pain.  Lab work, urinalysis, and CT renal are unremarkable.  Given that the pain is reproducible with palpation and extends from the posterior lower ribs to the anterior lower ribs, this is likely costochondritis.  Muscle relaxer medication will be sent to pharmacy. Constipation.  Patient also wanted to discuss chronic constipation issues.  Patient given information for  simple bowel regimen including recommendation for daily hydration, physical exercise, and adding a stool softener.  As well as instructions for use of MiraLAX.  If she continues to have issues with constipation she may want to establish with a gastroenterologist for ongoing bowel regimen recommendations.   Reevaluation:  After the interventions noted above, I reevaluated the patient and found that they have :improved   Dispostion:  After consideration of the diagnostic results and the patients response to treatment, I feel that the patent would benefit from ***.   Medical Decision Making Amount and/or Complexity of Data Reviewed Labs: ordered. Radiology: ordered.   This note was produced using Electronics Engineer. While the provider has reviewed and verified all clinical information, transcription errors may remain.    Final diagnoses:  Left flank pain    ED Discharge Orders          Ordered    methocarbamol  (ROBAXIN ) 500 MG tablet  2 times daily PRN        10/10/24 2057

## 2024-10-10 NOTE — ED Provider Notes (Incomplete)
 [place] [facility] Provider Note   CSN: [id number] Arrival date & time: 10/10/24  1600     Patient presents with: Flank Pain   Brenda Preston is a 43 y.o. female.   Patient is here for evaluation of left-sided flank pain.  Patient has been having left-sided flank pain for the past week that worsened today.  She also reports fatigue since symptom onset as well as intermittent nausea without emesis.  She denies dysuria, hematuria, increased frequency, or foul-smelling urine.  She does report occasional dark-colored urine.  Patient works in [employer] where she is repeatedly lifting things so she initially thought the pain was musculoskeletal in nature.  She also has a history of sciatic nerve pain though this pain has slightly above where that normally is.  However when the pain got worse today he decided to come in for further evaluation.  She has had a kidney infection in the past though it has been many years ago so she cannot recall exactly how the pain had felt.  She has had UTIs in the past and states she typically has low mid abdominal pain with those.  She denies any abdominal pain at this time.  She also denies any recent fevers, V/D, shortness of breath, chest pain, or any recent illnesses.  She denies any change in medications.  Last bowel movement was this morning and it was normal without any blood in stool or melena.  She occasionally has constipation for which she takes over-the-counter medications.    The history is provided by the patient.  Flank Pain This is a new problem. The current episode started more than 2 days ago.       Prior to Admission medications   Medication Sig Start Date End Date Taking? Authorizing Provider  methocarbamol  (ROBAXIN ) 500 MG tablet Take 1 tablet (500 mg total) by mouth 2 (two) times daily as needed for muscle spasms. 10/10/24  Yes Rosina Almarie LABOR, PA-C  diclofenac  (VOLTAREN ) 50 MG EC  tablet Take 1 tablet (50 mg total) by mouth 2 (two) times daily. 11/29/22   Griselda Almarie, MD  famotidine  (PEPCID ) 20 MG tablet Take 1 tablet (20 mg total) by mouth 2 (two) times daily. 11/29/22   Griselda Almarie, MD  methylPREDNISolone  (MEDROL  DOSEPAK) 4 MG TBPK tablet Take according to label instructions 11/29/22   Griselda Almarie, MD    Allergies: Hydrocodone    Review of Systems  Genitourinary:  Positive for flank pain.    Updated Vital Signs BP (!) 148/94 (BP Location: Right Arm)   Pulse 83   Temp 98.1 F (36.7 C)   Resp 20   Ht 5' 7 (1.702 m)   Wt 95.3 kg   SpO2 96%   BMI 32.91 kg/m   Physical Exam Vitals and nursing note reviewed.  Constitutional:      General: She is not in acute distress.    Appearance: Normal appearance. She is not ill-appearing or toxic-appearing.  HENT:     Head: Normocephalic and atraumatic.     Nose: Nose normal.     Mouth/Throat:     Mouth: Mucous membranes are moist.  Eyes:     General: No scleral icterus.    Extraocular Movements: Extraocular movements intact.     Conjunctiva/sclera: Conjunctivae normal.  Cardiovascular:     Rate and Rhythm: Normal rate and regular rhythm.     Pulses: Normal pulses.     Heart sounds: Normal heart sounds.  Pulmonary:     Effort: Pulmonary effort is normal. No respiratory distress.     Breath sounds: Normal breath sounds. No stridor. No wheezing, rhonchi or rales.  Abdominal:     General: Abdomen is flat. Bowel sounds are normal. There is no distension.     Palpations: Abdomen is soft.     Tenderness: There is no abdominal tenderness. There is no right CVA tenderness, left CVA tenderness or guarding.  Musculoskeletal:        General: Tenderness (Tenderness with palpation of left posterior low thoracic region.) present. No swelling, deformity or signs of injury. Normal range of motion.  Skin:    General: Skin is warm and dry.     Capillary Refill: Capillary refill takes less than 2 seconds.      Coloration: Skin is not jaundiced or pale.  Neurological:     Mental Status: She is alert and oriented to person, place, and time.     (all labs ordered are listed, but only abnormal results are displayed) Labs Reviewed  URINALYSIS, ROUTINE W REFLEX MICROSCOPIC - Abnormal; Notable for the following components:      Result Value   pH 8.5 (*)    All other components within normal limits  BASIC METABOLIC PANEL WITH GFR - Abnormal; Notable for the following components:   Glucose, Bld 111 (*)    All other components within normal limits  CBC - Abnormal; Notable for the following components:   WBC 3.8 (*)    Hemoglobin 10.5 (*)    HCT 33.3 (*)    MCV 76.0 (*)    MCH 24.0 (*)    RDW 17.8 (*)    All other components within normal limits  PREGNANCY, URINE    EKG: None  Radiology: CT Renal Stone Study Result Date: 10/10/2024 CLINICAL DATA:  Left-sided flank pain for 1 week, nausea and fatigue EXAM: CT ABDOMEN AND PELVIS WITHOUT CONTRAST TECHNIQUE: Multidetector CT imaging of the abdomen and pelvis was performed following the standard protocol without IV contrast. RADIATION DOSE REDUCTION: This exam was performed according to the departmental dose-optimization program which includes automated exposure control, adjustment of the mA and/or kV according to patient size and/or use of iterative reconstruction technique. COMPARISON:  10/15/2022 FINDINGS: Lower chest: No acute pleural or parenchymal lung disease. Hepatobiliary: Unremarkable unenhanced appearance of the liver and gallbladder. Pancreas: Unremarkable unenhanced appearance. Spleen: Unremarkable unenhanced appearance. Adrenals/Urinary Tract: No urinary tract calculi or obstructive uropathy within either kidney. The adrenals and bladder are unremarkable. Stomach/Bowel: No bowel obstruction or ileus. Normal appendix right lower quadrant. No bowel wall thickening or inflammatory change. Vascular/Lymphatic: No significant vascular findings are  present. No enlarged abdominal or pelvic lymph nodes. Reproductive: Uterus and bilateral adnexa are unremarkable. Other: No free fluid or free intraperitoneal gas. No abdominal wall hernia. Musculoskeletal: No acute or destructive bony abnormalities. Reconstructed images demonstrate no additional findings. IMPRESSION: 1. Unremarkable unenhanced CT of the abdomen and pelvis. No urinary tract calculi or obstructive uropathy. Electronically Signed   By: Ozell Daring M.D.   On: 10/10/2024 20:06     Procedures   Medications Ordered in the ED - No data to display   Patient presents to the ED for concern of left-sided flank pain, this involves an extensive number of treatment options, and is a complaint that carries with it a high risk of complications and morbidity.  The differential diagnosis includes pyelonephritis, kidney stone, UTI, musculoskeletal***   Co morbidities that complicate the patient evaluation  none  Lab Tests:  I Ordered, and personally interpreted labs.  The pertinent results include: Lab work and urinalysis are unremarkable.   Imaging Studies ordered:  I ordered imaging studies including CT renal study I independently visualized and interpreted imaging which showed no urinary tract calculi or obstructive uropathy. I agree with the radiologist interpretation   Problem List / ED Course:  Left flank pain.  Lab work, urinalysis, and CT renal are unremarkable.  Given that the pain is reproducible with palpation and extends from the posterior lower ribs to the anterior lower ribs, this is likely costochondritis.  Muscle relaxer medication will be sent to pharmacy. Constipation.  Patient also wanted to discuss chronic constipation issues.  Patient given information for simple bowel regimen including recommendation for daily hydration, physical exercise, and adding a stool softener.  As well as instructions for use of MiraLAX.  If she continues to have issues with  constipation she may want to establish with a gastroenterologist for ongoing bowel regimen recommendations.   Reevaluation:  After the interventions noted above, I reevaluated the patient and found that they have :improved   Dispostion:  After consideration of the diagnostic results and the patients response to treatment, I feel that the patent would benefit from supportive care in the home setting.   Medical Decision Making Amount and/or Complexity of Data Reviewed Labs: ordered. Radiology: ordered.  Risk Prescription drug management.   This note was produced using Electronics Engineer. While the provider has reviewed and verified all clinical information, transcription errors may remain.    Final diagnoses:  Left flank pain    ED Discharge Orders          Ordered    methocarbamol  (ROBAXIN ) 500 MG tablet  2 times daily PRN        10/10/24 2057

## 2024-10-10 NOTE — Discharge Instructions (Addendum)
 It was a pleasure meeting with you today.  As we discussed your imaging did not show evidence of a kidney stone and your urinalysis did not show evidence of infection.  Your pain is likely musculoskeletal/costochondritis.  I have sent medication to your pharmacy.  If symptoms persist or worsen, or any other concerning symptoms develop please return for further evaluation.  For constipation: Mix 4 doses of miralax into 32 oz of a sports drink like Gatorade. Drink over 2 hours or until you have a good bowel movement. You may repeat once more if needed.  Be sure to hydrate well and add physical exercise in daily to promote regular bowel movements.  Begin taking a daily stool softener like Metamucil or Benefiber.  If you continue to have issues with constipation you may want to establish with a gastroenterologist to discuss further bowel regimens.

## 2024-10-10 NOTE — ED Notes (Signed)
 Snacks provided to visitors; pt to CT
